# Patient Record
Sex: Male | Born: 1980 | Race: Black or African American | Hispanic: No | Marital: Married | Smoking: Never smoker
Health system: Southern US, Community
[De-identification: ages and names within clinical notes are randomized; demographics above are authoritative.]

## PROBLEM LIST (undated history)

## (undated) DIAGNOSIS — R51 Headache: Secondary | ICD-10-CM

## (undated) DIAGNOSIS — R519 Headache, unspecified: Secondary | ICD-10-CM

## (undated) DIAGNOSIS — G4489 Other headache syndrome: Principal | ICD-10-CM

## (undated) HISTORY — DX: Headache, unspecified: R51.9

## (undated) HISTORY — DX: Other headache syndrome: G44.89

## (undated) HISTORY — DX: Headache: R51

## (undated) HISTORY — PX: PATELLAR TENDON REPAIR: SHX737

---
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
## 2000-12-02 ENCOUNTER — Encounter: Payer: Self-pay | Admitting: Emergency Medicine

## 2000-12-02 ENCOUNTER — Emergency Department (HOSPITAL_COMMUNITY): Admission: EM | Admit: 2000-12-02 | Discharge: 2000-12-02 | Payer: Self-pay | Admitting: Emergency Medicine

## 2000-12-02 ENCOUNTER — Encounter: Payer: Self-pay | Admitting: *Deleted

## 2001-07-08 ENCOUNTER — Emergency Department (HOSPITAL_COMMUNITY): Admission: AC | Admit: 2001-07-08 | Discharge: 2001-07-08 | Payer: Self-pay

## 2001-07-08 ENCOUNTER — Encounter: Payer: Self-pay | Admitting: Emergency Medicine

## 2004-04-05 ENCOUNTER — Emergency Department (HOSPITAL_COMMUNITY): Admission: EM | Admit: 2004-04-05 | Discharge: 2004-04-05 | Payer: Self-pay | Admitting: Emergency Medicine

## 2004-04-06 ENCOUNTER — Emergency Department (HOSPITAL_COMMUNITY): Admission: EM | Admit: 2004-04-06 | Discharge: 2004-04-06 | Payer: Self-pay | Admitting: Emergency Medicine

## 2007-06-25 ENCOUNTER — Emergency Department (HOSPITAL_COMMUNITY): Admission: EM | Admit: 2007-06-25 | Discharge: 2007-06-25 | Payer: Self-pay | Admitting: Emergency Medicine

## 2008-09-09 ENCOUNTER — Emergency Department (HOSPITAL_COMMUNITY): Admission: EM | Admit: 2008-09-09 | Discharge: 2008-09-10 | Payer: Self-pay | Admitting: Emergency Medicine

## 2009-05-21 ENCOUNTER — Emergency Department (HOSPITAL_COMMUNITY): Admission: EM | Admit: 2009-05-21 | Discharge: 2009-05-21 | Payer: Self-pay | Admitting: Emergency Medicine
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
## 2010-06-18 ENCOUNTER — Inpatient Hospital Stay (INDEPENDENT_AMBULATORY_CARE_PROVIDER_SITE_OTHER)
Admission: RE | Admit: 2010-06-18 | Discharge: 2010-06-18 | Disposition: A | Discharge status: Home / Self Care | Payer: Self-pay | Source: Ambulatory Visit | Attending: Family Medicine | Admitting: Family Medicine

## 2010-06-18 DIAGNOSIS — R197 Diarrhea, unspecified: Secondary | ICD-10-CM

## 2010-06-18 LAB — POCT I-STAT, CHEM 8
BUN: 8 mg/dL (ref 6–23)
Calcium, Ion: 1.11 mmol/L — ABNORMAL LOW (ref 1.12–1.32)
Chloride: 105 mEq/L (ref 96–112)
Creatinine, Ser: 1.5 mg/dL (ref 0.4–1.5)
Glucose, Bld: 90 mg/dL (ref 70–99)
HCT: 45 % (ref 39.0–52.0)
Hemoglobin: 15.3 g/dL (ref 13.0–17.0)
Potassium: 4.3 mEq/L (ref 3.5–5.1)
Sodium: 140 mEq/L (ref 135–145)
TCO2: 26 mmol/L (ref 0–100)

## 2011-02-24 ENCOUNTER — Other Ambulatory Visit: Payer: Self-pay | Admitting: Family Medicine

## 2011-02-24 DIAGNOSIS — N63 Unspecified lump in unspecified breast: Secondary | ICD-10-CM

## 2011-03-03 ENCOUNTER — Other Ambulatory Visit: Payer: Self-pay

## 2011-03-13 ENCOUNTER — Ambulatory Visit
Admission: RE | Admit: 2011-03-13 | Discharge: 2011-03-13 | Disposition: A | Discharge status: Home / Self Care | Payer: BC Managed Care – PPO | Source: Ambulatory Visit | Attending: Family Medicine | Admitting: Family Medicine

## 2011-03-13 DIAGNOSIS — N63 Unspecified lump in unspecified breast: Secondary | ICD-10-CM

## 2011-07-04 ENCOUNTER — Other Ambulatory Visit: Payer: Self-pay | Admitting: Family Medicine

## 2011-07-04 DIAGNOSIS — N62 Hypertrophy of breast: Secondary | ICD-10-CM

## 2011-07-10 ENCOUNTER — Other Ambulatory Visit: Payer: Self-pay | Admitting: Family Medicine

## 2011-07-10 ENCOUNTER — Ambulatory Visit
Admission: RE | Admit: 2011-07-10 | Discharge: 2011-07-10 | Disposition: A | Discharge status: Home / Self Care | Payer: BC Managed Care – PPO | Source: Ambulatory Visit | Attending: Family Medicine | Admitting: Family Medicine

## 2011-07-10 DIAGNOSIS — N62 Hypertrophy of breast: Secondary | ICD-10-CM

## 2016-02-24 ENCOUNTER — Emergency Department (HOSPITAL_COMMUNITY)
Admission: EM | Admit: 2016-02-24 | Discharge: 2016-02-24 | Disposition: A | Discharge status: Home / Self Care | Payer: Self-pay | Attending: Emergency Medicine | Admitting: Emergency Medicine

## 2016-02-24 ENCOUNTER — Encounter (HOSPITAL_COMMUNITY): Payer: Self-pay | Admitting: Emergency Medicine

## 2016-02-24 ENCOUNTER — Emergency Department (HOSPITAL_COMMUNITY): Payer: Self-pay

## 2016-02-24 DIAGNOSIS — R112 Nausea with vomiting, unspecified: Secondary | ICD-10-CM | POA: Insufficient documentation

## 2016-02-24 DIAGNOSIS — Z79899 Other long term (current) drug therapy: Secondary | ICD-10-CM | POA: Insufficient documentation

## 2016-02-24 LAB — CBC
HEMATOCRIT: 44.5 % (ref 39.0–52.0)
HEMOGLOBIN: 15.8 g/dL (ref 13.0–17.0)
MCH: 30.8 pg (ref 26.0–34.0)
MCHC: 35.5 g/dL (ref 30.0–36.0)
MCV: 86.7 fL (ref 78.0–100.0)
Platelets: 250 10*3/uL (ref 150–400)
RBC: 5.13 MIL/uL (ref 4.22–5.81)
RDW: 12.9 % (ref 11.5–15.5)
WBC: 12.3 10*3/uL — ABNORMAL HIGH (ref 4.0–10.5)

## 2016-02-24 LAB — URINALYSIS, ROUTINE W REFLEX MICROSCOPIC
BACTERIA UA: NONE SEEN
Bilirubin Urine: NEGATIVE
Glucose, UA: NEGATIVE mg/dL
Hgb urine dipstick: NEGATIVE
Ketones, ur: 80 mg/dL — AB
LEUKOCYTES UA: NEGATIVE
Nitrite: NEGATIVE
PH: 8 (ref 5.0–8.0)
Protein, ur: 30 mg/dL — AB
SPECIFIC GRAVITY, URINE: 1.025 (ref 1.005–1.030)
SQUAMOUS EPITHELIAL / LPF: NONE SEEN

## 2016-02-24 LAB — COMPREHENSIVE METABOLIC PANEL
ALBUMIN: 4.8 g/dL (ref 3.5–5.0)
ALT: 36 U/L (ref 17–63)
ANION GAP: 14 (ref 5–15)
AST: 45 U/L — AB (ref 15–41)
Alkaline Phosphatase: 42 U/L (ref 38–126)
BILIRUBIN TOTAL: 1.1 mg/dL (ref 0.3–1.2)
BUN: 13 mg/dL (ref 6–20)
CHLORIDE: 102 mmol/L (ref 101–111)
CO2: 23 mmol/L (ref 22–32)
Calcium: 10 mg/dL (ref 8.9–10.3)
Creatinine, Ser: 1.05 mg/dL (ref 0.61–1.24)
GFR calc Af Amer: >60 mL/min (ref >60)
GFR calc non Af Amer: >60 mL/min (ref >60)
GLUCOSE: 107 mg/dL — AB (ref 65–99)
POTASSIUM: 3.6 mmol/L (ref 3.5–5.1)
SODIUM: 139 mmol/L (ref 135–145)
TOTAL PROTEIN: 8.1 g/dL (ref 6.5–8.1)

## 2016-02-24 LAB — LIPASE, BLOOD: LIPASE: 24 U/L (ref 11–51)

## 2016-02-24 LAB — I-STAT TROPONIN, ED: TROPONIN I, POC: 0 ng/mL (ref 0.00–0.08)

## 2016-02-24 MED ORDER — PROMETHAZINE HCL 25 MG PO TABS: 25.0000 mg | ORAL_TABLET | Freq: Four times a day (QID) | ORAL | 0 refills | Status: DC | PRN

## 2016-02-24 MED ORDER — FAMOTIDINE IN NACL 20-0.9 MG/50ML-% IV SOLN
20.0000 mg | Freq: Once | INTRAVENOUS | Status: AC
  Administered 2016-02-24: 20 mg via INTRAVENOUS
  Filled 2016-02-24: qty 50

## 2016-02-24 MED ORDER — SODIUM CHLORIDE 0.9 % IV BOLUS (SEPSIS)
1000.0000 mL | Freq: Once | INTRAVENOUS | Status: AC
  Administered 2016-02-24: 1000 mL via INTRAVENOUS

## 2016-02-24 MED ORDER — ONDANSETRON HCL 4 MG/2ML IJ SOLN
4.0000 mg | Freq: Once | INTRAMUSCULAR | Status: AC
  Administered 2016-02-24: 4 mg via INTRAVENOUS
  Filled 2016-02-24: qty 2

## 2016-02-24 MED ORDER — PROMETHAZINE HCL 25 MG/ML IJ SOLN
25.0000 mg | Freq: Once | INTRAMUSCULAR | Status: AC
  Administered 2016-02-24: 25 mg via INTRAVENOUS
  Filled 2016-02-24: qty 1

## 2016-02-24 MED ORDER — ONDANSETRON 4 MG PO TBDP
4.0000 mg | ORAL_TABLET | Freq: Once | ORAL | Status: AC | PRN
  Administered 2016-02-24: 4 mg via ORAL
  Filled 2016-02-24: qty 1

## 2016-02-24 NOTE — Progress Notes (Signed)
CM spoke [place] who [place].  CM discussed and provided written information to assist pt with determining choice for uninsured accepting pcps, discussed the importance of pcp vs EDP services for f/u care, www.needymeds.org, www.goodrx.com, discounted pharmacies and other [place] resources such as [facility] , [facility], affordable care act, financial assistance, uninsured dental services, [place] med assist, DSS and  health department  Reviewed resources for [place] uninsured accepting pcps like [facility], [facility] [address], [facility], [facility], local urgent care centers, [facility], [facility], general medical clinics, [facility] services of the [facility], [facility] plus others, medication resources, [facility] out patient [facility]ies and housing Pt voiced understanding and appreciation of resources provided   Provided [facility] contact information Pt was seen by Kennyth ArnoldStacy of [facility] prior to d/c

## 2016-02-24 NOTE — ED Triage Notes (Addendum)
Pt reports "nauseous shooting pain" since early this am. No diarrhea. Complains of abd pain. Pt also complains of palpitations and CP with nausea. No SOB or dizziness.

## 2016-02-24 NOTE — ED Provider Notes (Signed)
[facility]-EMERGENCY DEPT Provider Note   CSN: [id number] Arrival date & time: 02/24/16  [id number]     History   Chief Complaint Chief Complaint  Patient presents with  . Emesis    HPI Jerome Wood is a 36 y.o. male.  Pt presents to the ED today with nausea, vomiting, and hiccups since early this morning.  Pt said he will get nauseous, get a hiccup, then throw up.  He feels some cp as well.  He denies diarrhea or fevers.      History reviewed. No pertinent past medical history.  There are no active problems to display for this patient.   History reviewed. No pertinent surgical history.     Home Medications    Prior to Admission medications   Medication Sig Start Date End Date Taking? Authorizing Provider  OVER THE COUNTER MEDICATION Take 1 Package by mouth daily.   Yes Historical Provider, MD  promethazine (PHENERGAN) 25 MG tablet Take 1 tablet (25 mg total) by mouth every 6 (six) hours as needed for nausea or vomiting. 02/24/16   Jacalyn LefevreJulie Bellamie Turney, MD    Family History History reviewed. No pertinent family history.  Social History Social History  Substance Use Topics  . Smoking status: Never Smoker  . Smokeless tobacco: Never Used  . Alcohol use No     Allergies   Patient has no known allergies.   Review of Systems Review of Systems  Gastrointestinal: Positive for abdominal pain, nausea and vomiting.  All other systems reviewed and are negative.    Physical Exam Updated Vital Signs BP 146/75   Pulse 79   Temp 98.6 F (37 C) (Oral)   Resp 16   SpO2 100%   Physical Exam  Constitutional: He is oriented to person, place, and time. He appears well-developed and well-nourished.  HENT:  Head: Normocephalic and atraumatic.  Right Ear: External ear normal.  Left Ear: External ear normal.  Nose: Nose normal.  Mouth/Throat: Mucous membranes are dry.  Eyes: Conjunctivae and EOM are normal. Pupils are equal, round, and reactive to light.  Neck: Normal range  of motion. Neck supple.  Cardiovascular: Normal rate, regular rhythm, normal heart sounds and intact distal pulses.   Pulmonary/Chest: Effort normal and breath sounds normal.  Abdominal: Soft. Bowel sounds are normal.  Musculoskeletal: Normal range of motion.  Neurological: He is alert and oriented to person, place, and time.  Skin: Skin is warm.  Psychiatric: He has a normal mood and affect. His behavior is normal. Judgment and thought content normal.  Nursing note and vitals reviewed.    ED Treatments / Results  Labs (all labs ordered are listed, but only abnormal results are displayed) Labs Reviewed  COMPREHENSIVE METABOLIC PANEL - Abnormal; Notable for the following:       Result Value   Glucose, Bld 107 (*)    AST 45 (*)    All other components within normal limits  CBC - Abnormal; Notable for the following:    WBC 12.3 (*)    All other components within normal limits  URINALYSIS, ROUTINE W REFLEX MICROSCOPIC - Abnormal; Notable for the following:    Ketones, ur 80 (*)    Protein, ur 30 (*)    All other components within normal limits  LIPASE, BLOOD  I-STAT TROPOININ, ED    EKG  EKG Interpretation  Date/Time:  Thursday February 24 2016 07:35:48 EST Ventricular Rate:  71 PR Interval:    QRS Duration: 93 QT Interval:  379 QTC  Calculation: 412 R Axis:   75 Text Interpretation:  Sinus rhythm ST elev, probable normal early repol pattern Baseline wander in lead(s) V4 V5 V6 No old tracing to compare Confirmed by [facility] MD, Celeste Candelas [phone]) on 02/24/2016 9:30:22 AM       Radiology Dg Chest 2 View  Result Date: 02/24/2016 CLINICAL DATA:  Chest pain for several hours EXAM: CHEST  2 VIEW COMPARISON:  09/10/2008 FINDINGS: The heart size and mediastinal contours are within normal limits. Both lungs are clear. The visualized skeletal structures are unremarkable. IMPRESSION: No active cardiopulmonary disease. Electronically Signed   By: Alcide Clever M.D.   On: 02/24/2016 07:51     Procedures Procedures (including critical care time)  Medications Ordered in ED Medications  ondansetron (ZOFRAN-ODT) disintegrating tablet 4 mg (4 mg Oral Given 02/24/16 0758)  sodium chloride 0.9 % bolus 1,000 mL (1,000 mLs Intravenous New Bag/Given 02/24/16 0957)  ondansetron (ZOFRAN) injection 4 mg (4 mg Intravenous Given 02/24/16 0957)  famotidine (PEPCID) IVPB 20 mg premix (0 mg Intravenous Stopped 02/24/16 1032)  promethazine (PHENERGAN) injection 25 mg (25 mg Intravenous Given 02/24/16 1152)     Initial Impression / Assessment and Plan / ED Course  I have reviewed the triage vital signs and the nursing notes.  Pertinent labs & imaging results that were available during my care of the patient were reviewed by me and considered in my medical decision making (see chart for details).    Pt feels much better.  He is able to tolerate po fluids.  Final Clinical Impressions(s) / ED Diagnoses   Final diagnoses:  Non-intractable vomiting with nausea, unspecified vomiting type    New Prescriptions New Prescriptions   PROMETHAZINE (PHENERGAN) 25 MG TABLET    Take 1 tablet (25 mg total) by mouth every 6 (six) hours as needed for nausea or vomiting.     Jacalyn Lefevre, MD 02/24/16 1302

## 2016-02-24 NOTE — ED Notes (Signed)
Two RNs have attempted to start IV and obtain blood unsuccessfully.  Will put in for IV team consult or ultrasound IV.

## 2016-08-06 ENCOUNTER — Emergency Department (HOSPITAL_COMMUNITY): Payer: Self-pay

## 2016-08-06 ENCOUNTER — Emergency Department (HOSPITAL_COMMUNITY)
Admission: EM | Admit: 2016-08-06 | Discharge: 2016-08-06 | Disposition: A | Discharge status: Home / Self Care | Payer: Self-pay | Attending: Emergency Medicine | Admitting: Emergency Medicine

## 2016-08-06 ENCOUNTER — Encounter (HOSPITAL_COMMUNITY): Payer: Self-pay

## 2016-08-06 DIAGNOSIS — M93272 Osteochondritis dissecans, left ankle and joints of left foot: Secondary | ICD-10-CM | POA: Insufficient documentation

## 2016-08-06 DIAGNOSIS — M25572 Pain in left ankle and joints of left foot: Secondary | ICD-10-CM

## 2016-08-06 MED ORDER — IBUPROFEN 600 MG PO TABS: 600.0000 mg | ORAL_TABLET | Freq: Four times a day (QID) | ORAL | 0 refills | Status: DC | PRN

## 2016-08-06 MED ORDER — HYDROCODONE-ACETAMINOPHEN 5-325 MG PO TABS: 1.0000 | ORAL_TABLET | Freq: Four times a day (QID) | ORAL | 0 refills | Status: DC | PRN

## 2016-08-06 NOTE — Discharge Instructions (Signed)
You have been seen today for ankle pain. There were no acute abnormalities on the x-rays, including no sign of fracture or dislocation. However, there were signs of a cartilage abnormality on the xray.  Pain: Take 600 mg of ibuprofen every 6 hours or 440 mg (over the counter dose) to 500 mg (prescription dose) of naproxen every 12 hours or for the next 3 days. After this time, these medications may be used as needed for pain. Take these medications with food to avoid upset stomach. Choose only one of these medications, do not take them together.  Vicodin for severe pain. Do not drive or perform other dangerous activities while taking the Vicodin. Ice: May apply ice to the area over the next 24 hours for 15 minutes at a time to reduce swelling. Elevation: Keep the extremity elevated as often as possible to reduce pain and inflammation. Support: Wear the brace for support and comfort. Wear this until pain resolves. You will be weight-bearing as tolerated, which means you can slowly start to put weight on the extremity and increase amount and frequency as pain allows. Exercises: Start by performing these exercises a few times a week, increasing the frequency until you are performing them twice daily.  Follow up: Follow-up with the orthopedic specialist as soon as possible on this manner. Call the number provided to set up an appointment.

## 2016-08-06 NOTE — ED Triage Notes (Signed)
Pt with left ankle injury during flag football today.

## 2016-08-06 NOTE — ED Provider Notes (Signed)
[facility]-EMERGENCY DEPT Provider Note   CSN: [id number] Arrival date & time: 08/06/16  1657     History   Chief Complaint Chief Complaint  Patient presents with  . Ankle Injury    HPI Jerome Wood is a 36 y.o. male.  HPI    Jerome Wood is a 36 y.o. male, patient with no listed, pertinent past medical history, presenting to the ED with left ankle pain following a game of football today. Patient does not recall specific injury, but pain began after the physical activity and then worsened from there. Pain is moderate, aching, nonradiating. Patient states he has had pain in this ankle previously "for years," but has not been evaluated for it. Patient denies neuro deficits, falls, knee pain, or any other complaints.   History reviewed. No pertinent past medical history.  There are no active problems to display for this patient.   History reviewed. No pertinent surgical history.     Home Medications    Prior to Admission medications   Medication Sig Start Date End Date Taking? Authorizing Provider  HYDROcodone-acetaminophen (NORCO/VICODIN) 5-325 MG tablet Take 1-2 tablets by mouth every 6 (six) hours as needed for severe pain. 08/06/16   Hasnain Manheim C, PA-C  ibuprofen (ADVIL,MOTRIN) 600 MG tablet Take 1 tablet (600 mg total) by mouth every 6 (six) hours as needed. 08/06/16   Lutie Pickler C, PA-C  OVER THE COUNTER MEDICATION Take 1 Package by mouth daily.    [provider]  promethazine (PHENERGAN) 25 MG tablet Take 1 tablet (25 mg total) by mouth every 6 (six) hours as needed for nausea or vomiting. 02/24/16   Jacalyn Lefevre, MD    Family History History reviewed. No pertinent family history.  Social History Social History  Substance Use Topics  . Smoking status: Never Smoker  . Smokeless tobacco: Never Used  . Alcohol use No     Allergies   Patient has no known allergies.   Review of Systems Review of Systems  Musculoskeletal: Positive for arthralgias.  Negative for joint swelling.  Skin: Negative for wound.  Neurological: Negative for weakness and numbness.     Physical Exam Updated Vital Signs BP (!) 144/88 (BP Location: Left Arm)   Pulse 62   Temp 98 F (36.7 C) (Oral)   Resp 14   SpO2 98%   Physical Exam  Constitutional: He appears well-developed and well-nourished. No distress.  HENT:  Head: Normocephalic and atraumatic.  Eyes: Conjunctivae are normal.  Neck: Neck supple.  Cardiovascular: Normal rate, regular rhythm and intact distal pulses.   Pulmonary/Chest: Effort normal.  Musculoskeletal: He exhibits tenderness. He exhibits no edema or deformity.  Tenderness without swelling to the left lateral ankle inferior to the lateral malleolus. No noted crepitus, color change, deformity, or laxity. Patient is weightbearing, but with pain.   Neurological: He is alert.  Skin: Skin is warm and dry. Capillary refill takes less than 2 seconds. He is not diaphoretic.  Psychiatric: He has a normal mood and affect. His behavior is normal.  Nursing note and vitals reviewed.    ED Treatments / Results  Labs (all labs ordered are listed, but only abnormal results are displayed) Labs Reviewed - No data to display  EKG  EKG Interpretation None       Radiology Dg Ankle Complete Left  Result Date: 08/06/2016 CLINICAL DATA:  Pain EXAM: LEFT ANKLE COMPLETE - 3+ VIEW COMPARISON:  None. FINDINGS: Frontal, oblique, and lateral views were obtained. 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 No acute fracture evident. No joint effusion. No joint space narrowing or erosion. Ankle mortise appears intact. 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 No acute fracture or joint effusion. Ankle mortise appears intact. No appreciable joint space narrowing. Electronically Signed   By: Bretta BangWilliam  Woodruff III M.D.   On: 08/06/2016 18:06    Procedures Procedures (including  critical care time)  Medications Ordered in ED Medications - No data to display   Initial Impression / Assessment and Plan / ED Course  I have reviewed the triage vital signs and the nursing notes.  Pertinent labs & imaging results that were available during my care of the patient were reviewed by me and considered in my medical decision making (see chart for details).      Patient presents with left ankle pain following physical activity. 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 Patient was informed of this finding with explanation. Ankle brace, crutches, and orthopedic follow-up. The patient was given instructions for home care as well as return precautions. Patient voices understanding of these instructions, accepts the plan, and is comfortable with discharge.    Final Clinical Impressions(s) / ED Diagnoses   Final diagnoses:  Acute left ankle pain    New Prescriptions Discharge Medication List as of 08/06/2016  7:55 PM    START taking these medications   Details  HYDROcodone-acetaminophen (NORCO/VICODIN) 5-325 MG tablet Take 1-2 tablets by mouth every 6 (six) hours as needed for severe pain., Starting Sun 08/06/2016, Print    ibuprofen (ADVIL,MOTRIN) 600 MG tablet Take 1 tablet (600 mg total) by mouth every 6 (six) hours as needed., Starting Sun 08/06/2016, Print         Atoya Andrew C, PA-C 08/07/16 0919    Little, Ambrose Finlandachel Morgan, MD 08/07/16 1601

## 2017-03-01 ENCOUNTER — Emergency Department (HOSPITAL_COMMUNITY)
Admission: EM | Admit: 2017-03-01 | Discharge: 2017-03-01 | Disposition: A | Discharge status: Home / Self Care | Payer: Self-pay | Attending: Emergency Medicine | Admitting: Emergency Medicine

## 2017-03-01 ENCOUNTER — Other Ambulatory Visit: Payer: Self-pay

## 2017-03-01 ENCOUNTER — Emergency Department (HOSPITAL_COMMUNITY): Payer: Self-pay

## 2017-03-01 ENCOUNTER — Encounter (HOSPITAL_COMMUNITY): Payer: Self-pay | Admitting: Emergency Medicine

## 2017-03-01 DIAGNOSIS — I880 Nonspecific mesenteric lymphadenitis: Secondary | ICD-10-CM | POA: Insufficient documentation

## 2017-03-01 LAB — LIPASE, BLOOD: Lipase: 31 U/L (ref 11–51)

## 2017-03-01 LAB — COMPREHENSIVE METABOLIC PANEL
ALK PHOS: 53 U/L (ref 38–126)
ALT: 54 U/L (ref 17–63)
AST: 34 U/L (ref 15–41)
Albumin: 4.1 g/dL (ref 3.5–5.0)
Anion gap: 10 (ref 5–15)
BUN: 10 mg/dL (ref 6–20)
CALCIUM: 9.3 mg/dL (ref 8.9–10.3)
CHLORIDE: 98 mmol/L — AB (ref 101–111)
CO2: 27 mmol/L (ref 22–32)
Creatinine, Ser: 1.13 mg/dL (ref 0.61–1.24)
GFR calc Af Amer: >60 mL/min (ref >60)
GFR calc non Af Amer: >60 mL/min (ref >60)
GLUCOSE: 94 mg/dL (ref 65–99)
Potassium: 3.3 mmol/L — ABNORMAL LOW (ref 3.5–5.1)
SODIUM: 135 mmol/L (ref 135–145)
Total Bilirubin: 1.4 mg/dL — ABNORMAL HIGH (ref 0.3–1.2)
Total Protein: 7.7 g/dL (ref 6.5–8.1)

## 2017-03-01 LAB — CBC
HCT: 45.2 % (ref 39.0–52.0)
Hemoglobin: 16.2 g/dL (ref 13.0–17.0)
MCH: 30.6 pg (ref 26.0–34.0)
MCHC: 35.8 g/dL (ref 30.0–36.0)
MCV: 85.3 fL (ref 78.0–100.0)
PLATELETS: 159 10*3/uL (ref 150–400)
RBC: 5.3 MIL/uL (ref 4.22–5.81)
RDW: 12.3 % (ref 11.5–15.5)
WBC: 10.6 10*3/uL — ABNORMAL HIGH (ref 4.0–10.5)

## 2017-03-01 LAB — URINALYSIS, ROUTINE W REFLEX MICROSCOPIC
BILIRUBIN URINE: NEGATIVE
GLUCOSE, UA: NEGATIVE mg/dL
HGB URINE DIPSTICK: NEGATIVE
Ketones, ur: 5 mg/dL — AB
Leukocytes, UA: NEGATIVE
Nitrite: NEGATIVE
PROTEIN: NEGATIVE mg/dL
SPECIFIC GRAVITY, URINE: 1.029 (ref 1.005–1.030)
pH: 6 (ref 5.0–8.0)

## 2017-03-01 MED ORDER — IOPAMIDOL (ISOVUE-300) INJECTION 61%
INTRAVENOUS | Status: AC
  Administered 2017-03-01: 100 mL
  Filled 2017-03-01: qty 100

## 2017-03-01 MED ORDER — FENTANYL CITRATE (PF) 100 MCG/2ML IJ SOLN
50.0000 ug | Freq: Once | INTRAMUSCULAR | Status: AC
Start: 2017-03-01 — End: 2017-03-01
  Administered 2017-03-01: 50 ug via INTRAVENOUS
  Filled 2017-03-01: qty 2

## 2017-03-01 MED ORDER — ONDANSETRON HCL 4 MG/2ML IJ SOLN
4.0000 mg | Freq: Once | INTRAMUSCULAR | Status: AC
  Administered 2017-03-01: 4 mg via INTRAVENOUS
  Filled 2017-03-01: qty 2

## 2017-03-01 MED ORDER — DICYCLOMINE HCL 20 MG PO TABS
20.0000 mg | ORAL_TABLET | Freq: Once | ORAL | Status: AC
  Administered 2017-03-01: 20 mg via ORAL
  Filled 2017-03-01: qty 1

## 2017-03-01 MED ORDER — PROMETHAZINE HCL 25 MG PO TABS: 25.0000 mg | ORAL_TABLET | Freq: Four times a day (QID) | ORAL | 0 refills | Status: DC | PRN

## 2017-03-01 MED ORDER — OXYCODONE HCL 5 MG PO TABS: 2.5000 mg | ORAL_TABLET | ORAL | 0 refills | Status: DC | PRN

## 2017-03-01 MED ORDER — FENTANYL CITRATE (PF) 100 MCG/2ML IJ SOLN
50.0000 ug | Freq: Once | INTRAMUSCULAR | Status: AC
  Administered 2017-03-01: 50 ug via INTRAVENOUS
  Filled 2017-03-01: qty 2

## 2017-03-01 NOTE — ED Provider Notes (Signed)
[facility]-EMERGENCY DEPT Provider Note   CSN: [id number] Arrival date & time: 03/01/17  1258     History   Chief Complaint Chief Complaint  Patient presents with  . Abdominal Pain  . Emesis    HPI Jerome Wood is a 37 y.o. male  Abdominal Pain: Patient complains of abdominal pain. The pain is described as colicky, and is 6/10 in intensity. Pain is located in the RUQ/LUQ without radiation. Onset was 3 days ago. Symptoms have been gradually improving since. Aggravating factors: none.  Alleviating factors:none. Associated symptoms:emesis several  times, beginning 3 days ago, loss of appetite and diarrhea. The patient denies bloody or bilious emesis.. Patient sxs of N/V/D and abdominal pain began Monday.  He was out of town for the probable.  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  He denies any other travel, ingestion of suspicious foods.   HPI  History reviewed. No pertinent past medical history.  There are no active problems to display for this patient.   History reviewed. No pertinent surgical history.     Home Medications    Prior to Admission medications   Medication Sig Start Date End Date Taking? Authorizing Provider  acetaminophen (TYLENOL) 500 MG tablet Take 1,000 mg by mouth every 6 (six) hours as needed for mild pain.   Yes [provider]  promethazine (PHENERGAN) 25 MG tablet Take 1 tablet (25 mg total) by mouth every 6 (six) hours as needed for nausea or vomiting. 02/24/16  Yes Jacalyn LefevreHaviland, Julie, MD    Family History No family history on file.  Social History Social History   Tobacco Use  . Smoking status: Never Smoker  . Smokeless tobacco: Never Used  Substance Use Topics  . Alcohol use: No  . Drug use: Not on file     Allergies   Patient has no known allergies.   Review of Systems Review of Systems Ten systems reviewed and are negative for acute change, except as noted in  the HPI.    Physical Exam Updated Vital Signs BP (!) 165/97   Pulse 64   Temp 98.5 F (36.9 C) (Oral)   Resp 16   SpO2 97%   Physical Exam  Constitutional: He appears well-developed and well-nourished. No distress.  HENT:  Head: Normocephalic and atraumatic.  Eyes: Conjunctivae are normal. No scleral icterus.  Neck: Normal range of motion. Neck supple.  Cardiovascular: Normal rate, regular rhythm and normal heart sounds.  Pulmonary/Chest: Effort normal and breath sounds normal. No respiratory distress.  Abdominal: Soft. Bowel sounds are normal. There is tenderness in the right upper quadrant and right lower quadrant.  Musculoskeletal: He exhibits no edema.  Neurological: He is alert.  Skin: Skin is warm and dry. He is not diaphoretic.  Psychiatric: His behavior is normal.  Nursing note and vitals reviewed.    ED Treatments / Results  Labs (all labs ordered are listed, but only abnormal results are displayed) Labs Reviewed  URINALYSIS, ROUTINE W REFLEX MICROSCOPIC - Abnormal; Notable for the following components:      Result Value   Ketones, ur 5 (*)    All other components within normal limits  LIPASE, BLOOD  COMPREHENSIVE METABOLIC PANEL  CBC    EKG  EKG Interpretation None       Radiology No results found.  Procedures Procedures (including critical care time)  Medications Ordered in ED Medications  ondansetron (ZOFRAN) injection 4 mg (4 mg Intravenous Given  03/01/17 1554)  dicyclomine (BENTYL) tablet 20 mg (20 mg Oral Given 03/01/17 1553)     Initial Impression / Assessment and Plan / ED Course  I have reviewed the triage vital signs and the nursing notes.  Pertinent labs & imaging results that were available during my care of the patient were reviewed by me and considered in my medical decision making (see chart for details).     Patient with nausea vomiting diarrhea.  Point tender in the right lower quadrant with appendicolith seen on his CT  scan.  He also has written reactive mesenteric adenopathy.  I discussed the case with Dr. Ezzard Standing who states that having 4 days of persistent pain with nausea vomiting diarrhea is not consistent with appendicitis he does not feel that he needs to be seen or evaluated surgically.  Patient is not having any active vomiting or diarrhea at this time and symptoms do a seem to be improving.  He is noted to be hypertensive here and I have discussed this with the patient and he will need outpatient evaluation and management.  Patient unlikely to have bacterial process and will be treated symptom supportively.  His pain is fairly significant and will discharge patient with some pain medications.  I have advised the patient return for any worsening condition, intractable vomiting, fevers, new or concerning symptoms.  He appears appropriate for discharge at this time.  Final Clinical Impressions(s) / ED Diagnoses   Final diagnoses:  Acute mesenteric adenitis    ED Discharge Orders    None       Arthor Captain, PA-C 03/02/17 1610    Tilden Fossa, MD 03/02/17 1322

## 2017-03-01 NOTE — Discharge Instructions (Addendum)

## 2017-03-01 NOTE — ED Notes (Signed)
Pt verbalized being upset that this took so long and anxious to leave. While going over paperwork stated he does not need surgery, states its just a stomach bug and he is fine, just in pain from throwing up the last four days.

## 2017-03-01 NOTE — ED Triage Notes (Signed)
Pt complaint of abdominal pain with n/v/d since Monday; no n/v/d today.

## 2017-03-01 NOTE — ED Notes (Addendum)
[name] aware that patient c/o medications given has made pain worse.  Awaiting new orders.

## 2017-03-01 NOTE — ED Notes (Signed)
Dr Madilyn Hookees ED Provider at bedside.

## 2018-03-01 ENCOUNTER — Ambulatory Visit: Payer: 59 | Admitting: Neurology

## 2018-03-01 ENCOUNTER — Encounter: Payer: Self-pay | Admitting: Neurology

## 2018-03-01 ENCOUNTER — Other Ambulatory Visit: Payer: Self-pay

## 2018-03-01 DIAGNOSIS — G4489 Other headache syndrome: Secondary | ICD-10-CM

## 2018-03-01 HISTORY — DX: Other headache syndrome: G44.89

## 2018-03-01 MED ORDER — NORTRIPTYLINE HCL 10 MG PO CAPS: ORAL_CAPSULE | ORAL | 3 refills | Status: AC

## 2018-03-01 NOTE — Progress Notes (Signed)
Reason for visit: Headaches  Referring physician: Dr. Claudia Desanctis is a 38 y.o. male  History of present illness:  Jerome Wood is a 38 year old right-handed black male with a history of headaches that began in October 2019.  Prior to this, the patient would have only an occasional headache.  The headaches are associated with neck pain and stiffness and neuromuscular discomfort into the shoulders bilaterally.  The patient notes that the headaches will come up from the back of the head, sometimes he will have bitemporal headache pain with a squeezing sensation of the head.  The headaches were daily initially, but more recently they have cut back to being about twice a week.  The patient was placed on Aimovig and then converted to [facility] without benefit.  Flexeril was added but the patient has not continued this medication even though it helps some.  Diazepam was used occasionally but made the patient groggy and slow cognitively.  He indicates that he usually consumes at least one energy drink daily.  He has worked as a Systems analyst, but he has not done any workouts for 4 months.  The patient has soreness in the right shoulder that is more persistent.  He denies any numbness or weakness of extremities or difficulty with gait instability with exception that sometimes he feels a little bit off balance during a severe headache.  The headaches are not associated with photophobia or phonophobia.  He denies issues controlling the bowels or the bladder.  He is sent to this office for an evaluation.  He has undergone a CT scan of the brain, the results of this are not available to me.  The patient claims that his father has a history of sinus headaches.  Past Medical History:  Diagnosis Date  . Headache   . Headache syndrome 03/01/2018    Past Surgical History:  Procedure Laterality Date  . PATELLAR TENDON REPAIR Right     Family History  Problem Relation Age of Onset  . Diabetes  type II Mother   . Heart disease Father     Social history:  reports that he has never smoked. He has never used smokeless tobacco. He reports current alcohol use. He reports that he does not use drugs.  Medications:  Prior to Admission medications   Medication Sig Start Date End Date Taking? Authorizing Provider  cyclobenzaprine (FLEXERIL) 10 MG tablet  02/16/18  Yes [provider]  diazepam (VALIUM) 5 MG tablet  11/06/17  Yes [provider]  traMADol (ULTRAM) 50 MG tablet  01/14/18  Yes [provider]     No Known Allergies  ROS:  Out of a complete 14 system review of symptoms, the patient complains only of the following symptoms, and all other reviewed systems are negative.  Runny nose Restless legs, snoring Back pain  Blood pressure (!) 159/100, pulse 65, resp. rate 14, height 5\' 11"  (1.803 m), weight 236 lb (107 kg).  Physical Exam  General: The patient is alert and cooperative at the time of the examination.  Eyes: Pupils are equal, round, and reactive to light. Discs are flat bilaterally.  Neck: The neck is supple, no carotid bruits are noted.  Respiratory: The respiratory examination is clear.  Cardiovascular: The cardiovascular examination reveals a regular rate and rhythm, no obvious murmurs or rubs are noted.  Neuromuscular: Range of movement the cervical spine is relatively full.  No crepitus is noted in the temporomandibular joints.  Skin: Extremities are  without significant edema.  Neurologic Exam  Mental status: The patient is alert and oriented x 3 at the time of the examination. The patient has apparent normal recent and remote memory, with an apparently normal attention span and concentration ability.  Cranial nerves: Facial symmetry is present. There is good sensation of the face to pinprick and soft touch bilaterally. The strength of the facial muscles and the muscles to head turning and shoulder shrug are normal  bilaterally. Speech is well enunciated, no aphasia or dysarthria is noted. Extraocular movements are full. Visual fields are full. The tongue is midline, and the patient has symmetric elevation of the soft palate. No obvious hearing deficits are noted.  Motor: The motor testing reveals 5 over 5 strength of all 4 extremities. Good symmetric motor tone is noted throughout.  Sensory: Sensory testing is intact to pinprick, soft touch, vibration sensation, and position sense on all 4 extremities. No evidence of extinction is noted.  Coordination: Cerebellar testing reveals good finger-nose-finger and heel-to-shin bilaterally.  Gait and station: Gait is normal. Tandem gait is normal. Romberg is negative. No drift is seen.  Reflexes: Deep tendon reflexes are symmetric and normal bilaterally. Toes are downgoing bilaterally.   Assessment/Plan:  1.  Cervicogenic headache  The patient appears to have a headache syndrome most consistent with a cervicogenic headache with discomfort coming up from the shoulders and neck into the back of the head with a squeezing sensation.  The headaches have improved by cutting back on his workouts and by daily stretching.  The patient will be placed on nortriptyline taking 10 mg at night, working up to 30 mg nightly dose.  He will be sent for physical therapy for neuromuscular therapy.  He will follow-up here in about 3 months.  He will try to cut back on caffeine intake.  Jerome Wood. Keith Willis MD 03/01/2018 9:49 AM  [facility] [address] [place], [place] [id number]  Phone [phone] Fax [phone]

## 2018-03-01 NOTE — Patient Instructions (Signed)
We will start nortriptyline for the headache.   Pamelor (nortriptyline) is an antidepressant medication that has many uses that may include headache, whiplash injuries, or for peripheral neuropathy pain. Side effects may include drowsiness, dry mouth, blurred vision, or constipation. As with any antidepressant medication, worsening depression may occur. If you had any significant side effects, please call our office. The full effects of this medication may take 7-10 days after starting the drug, or going up on the dose.  

## 2019-05-26 IMAGING — CT CT ABD-PELV W/ CM
2 of 4 series · 16 of 46 positions shown, 18 images · IV contrast (iopamidol)
Comparison: 04/05/2004

CLINICAL DATA: Pt complaint of abdominal pain with n/v/d since
[REDACTED]; no n/v/d today.

EXAM:
CT ABDOMEN AND PELVIS WITH CONTRAST
TECHNIQUE: Multidetector CT imaging of the abdomen and pelvis was performed
using the standard protocol following bolus administration of
intravenous contrast.
CONTRAST:  100mL GE3SNL-2TT IOPAMIDOL (GE3SNL-2TT) INJECTION 61%

[Series 2: axial st · axial · 0.72mm/px · z∈[-622,-177]mm · 13 of 99 slices shown, 15 images]
[im 5/99  soft-tissue]
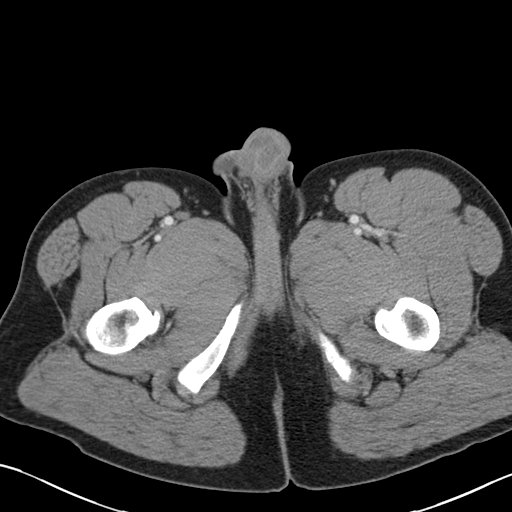
[im 5/99  bone]
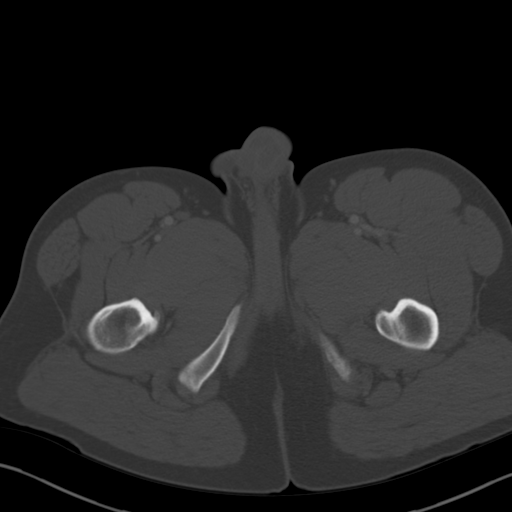
[im 14/99  soft-tissue]
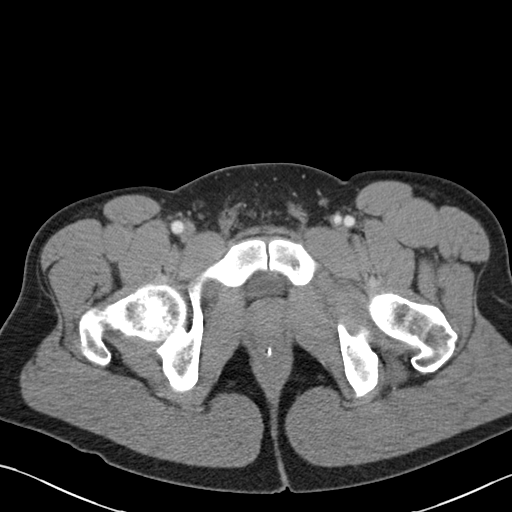
[im 23/99  soft-tissue]
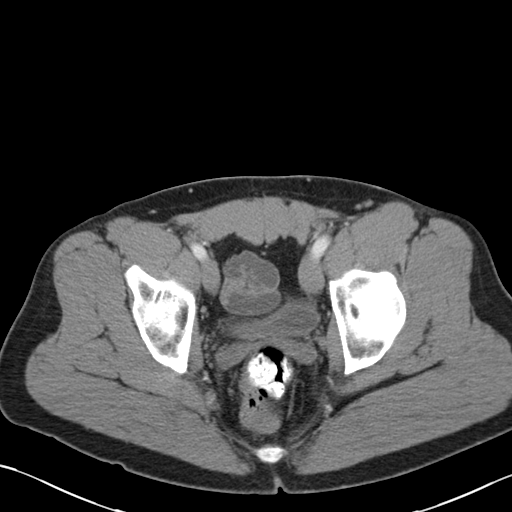
[im 27/99  soft-tissue]
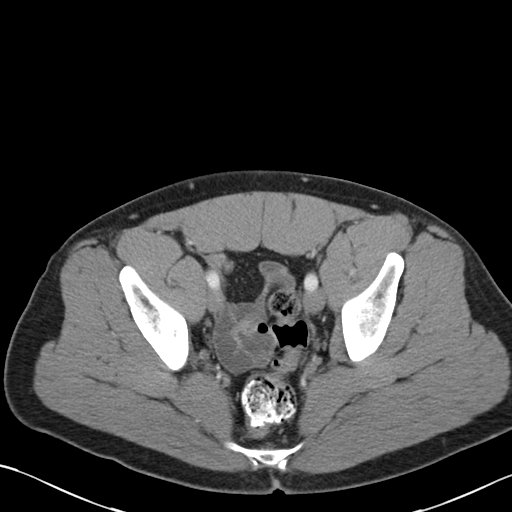
[im 36/99  soft-tissue]
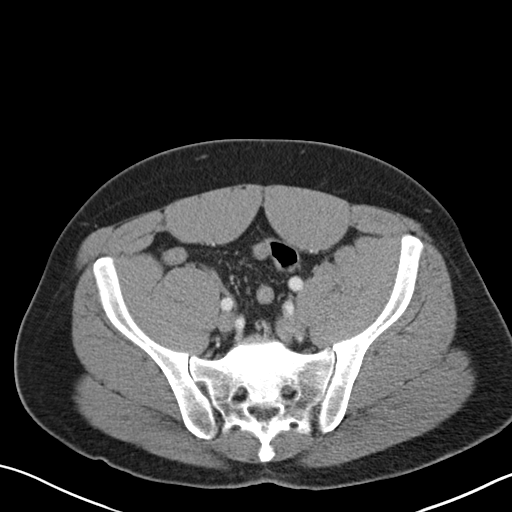
[im 41/99  soft-tissue]
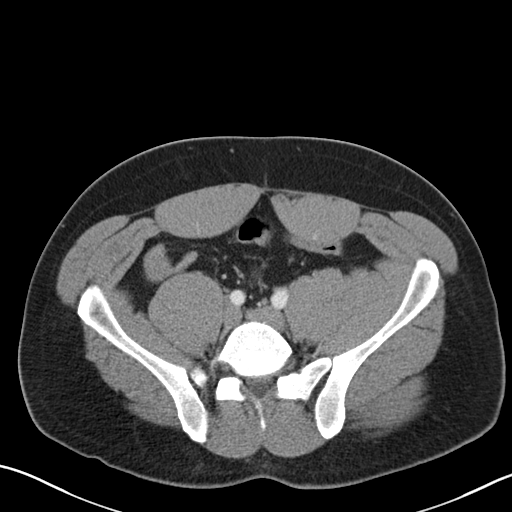
[im 49/99  soft-tissue]
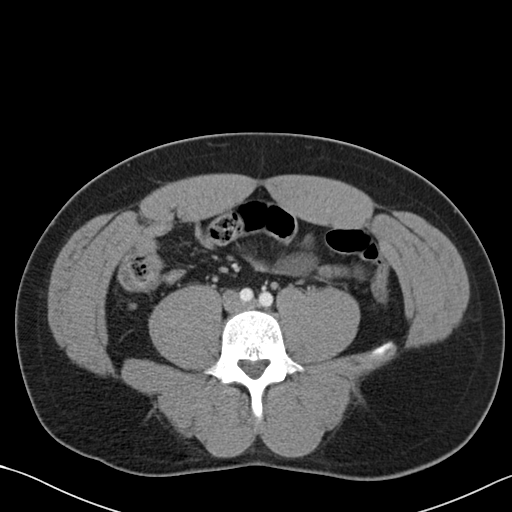
[im 58/99  soft-tissue]
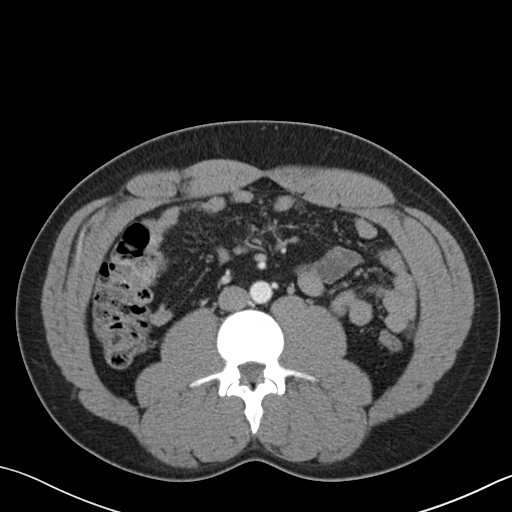
[im 63/99  soft-tissue]
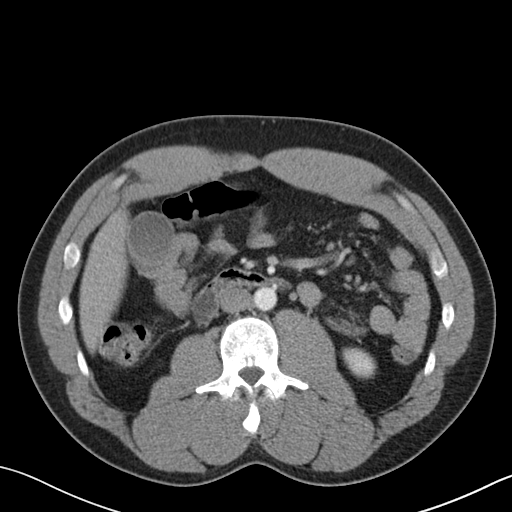
[im 63/99  bone]
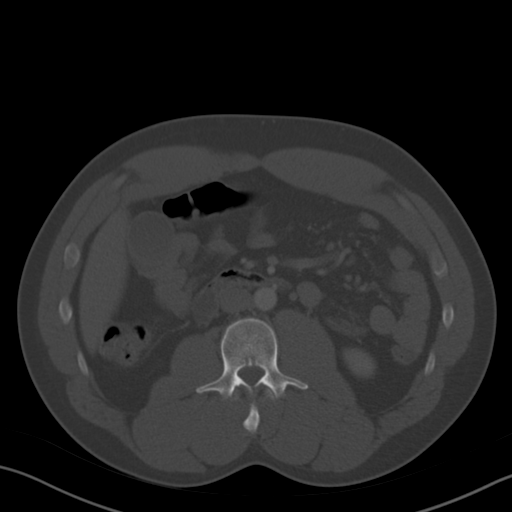
[im 72/99  soft-tissue]
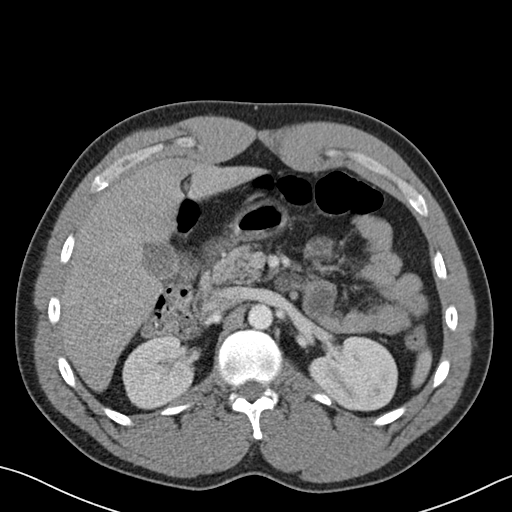
[im 76/99  soft-tissue]
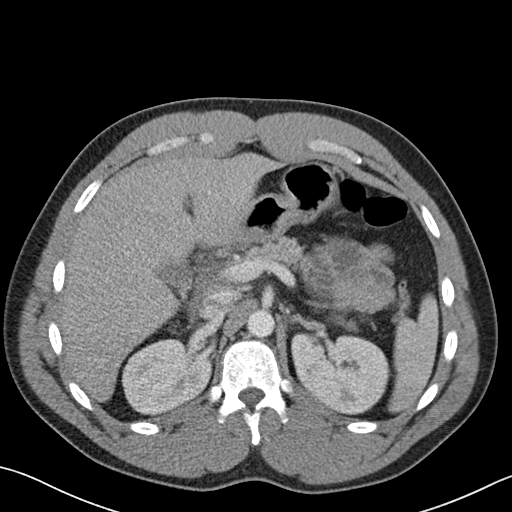
[im 85/99  soft-tissue]
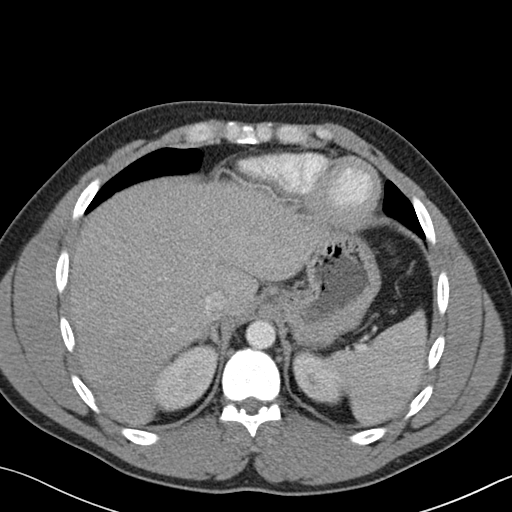
[im 94/99  soft-tissue]
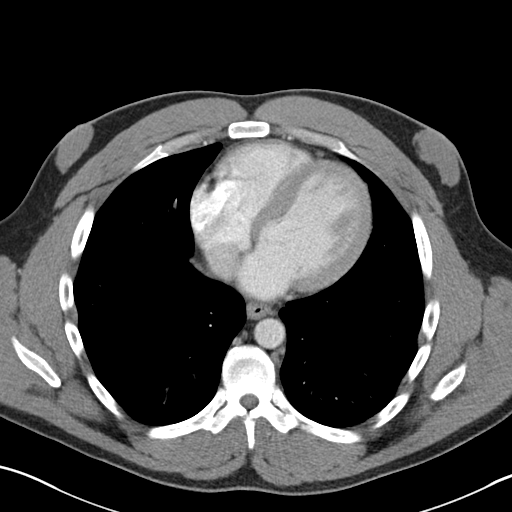

[Series 5: coronal st · coronal · 0.76mm/px · 3 of 94 slices shown]
[im 32/94  soft-tissue]
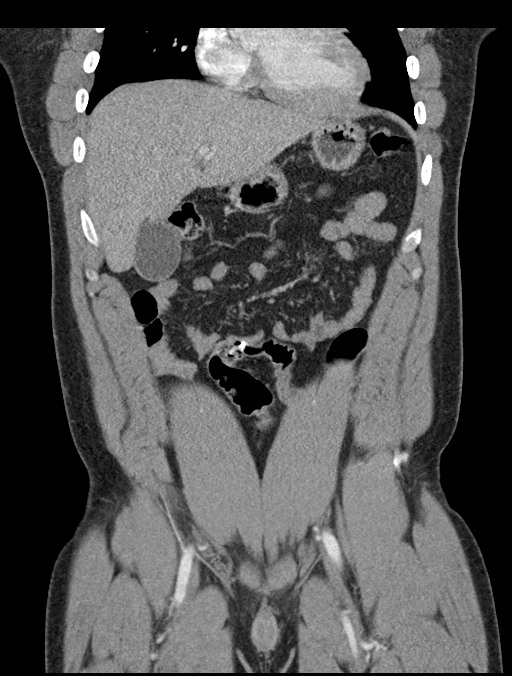
[im 42/94  soft-tissue]
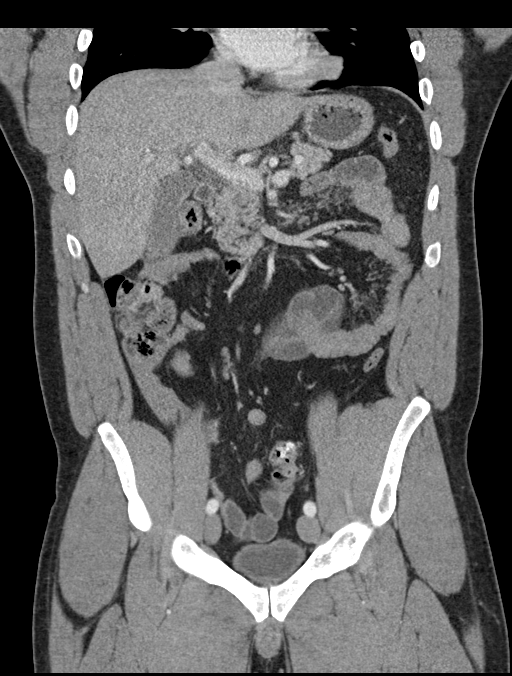
[im 52/94  soft-tissue]
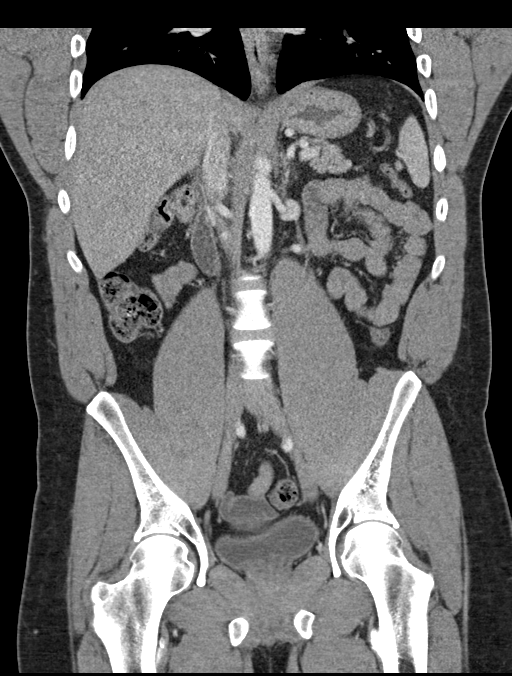

[16 of 46 positions shown; findings below may reference images not displayed]

FINDINGS: Lower chest: Lung bases are unremarkable. No pericardial effusion.
Note is made of bilateral gynecomastia.

Hepatobiliary: No focal liver abnormality is seen. No radiopaque
gallstones, biliary dilatation, or pericholecystic inflammatory
changes.

Pancreas: Unremarkable. No pancreatic ductal dilatation or
surrounding inflammatory changes.

Spleen: Normal in size without focal abnormality.

Adrenals/Urinary Tract: Adrenal glands are normal in appearance. No
hydronephrosis. No urinary tract obstruction. Urinary bladder is
decompressed and normal in appearance.

Stomach/Bowel: The stomach and small bowel loops are normal in
appearance. Loops of colon are normal in appearance. There is a
small appendicolith within normal caliber appendix. No
periappendiceal stranding.

Vascular/Lymphatic: No evidence for aortic aneurysm. There are small
mesenteric lymph nodes, measuring less than 1 centimeter in short
axis and nonspecific.

Reproductive: Prostate is unremarkable.

Other: No abdominal wall hernia or abnormality. No abdominopelvic
ascites.

Musculoskeletal: No acute or significant osseous findings.
IMPRESSION: 1. Appendicolith without other evidence for acute appendicitis.
2. Small mesenteric lymph nodes may indicate mesenteric adenitis. No
abscess or bowel obstruction.
3. Gynecomastia.

## 2022-08-16 ENCOUNTER — Emergency Department (HOSPITAL_COMMUNITY)
Admission: EM | Admit: 2022-08-16 | Discharge: 2022-08-16 | Disposition: A | Discharge status: Home / Self Care | Payer: Self-pay | Attending: Emergency Medicine | Admitting: Emergency Medicine

## 2022-08-16 ENCOUNTER — Encounter (HOSPITAL_COMMUNITY): Payer: Self-pay | Admitting: Emergency Medicine

## 2022-08-16 ENCOUNTER — Emergency Department (HOSPITAL_COMMUNITY): Payer: Self-pay

## 2022-08-16 ENCOUNTER — Other Ambulatory Visit: Payer: Self-pay

## 2022-08-16 DIAGNOSIS — M7989 Other specified soft tissue disorders: Secondary | ICD-10-CM | POA: Insufficient documentation

## 2022-08-16 DIAGNOSIS — W228XXA Striking against or struck by other objects, initial encounter: Secondary | ICD-10-CM | POA: Diagnosis not present

## 2022-08-16 DIAGNOSIS — R030 Elevated blood-pressure reading, without diagnosis of hypertension: Secondary | ICD-10-CM

## 2022-08-16 DIAGNOSIS — Y92007 Garden or yard of unspecified non-institutional (private) residence as the place of occurrence of the external cause: Secondary | ICD-10-CM | POA: Insufficient documentation

## 2022-08-16 DIAGNOSIS — S8991XA Unspecified injury of right lower leg, initial encounter: Secondary | ICD-10-CM | POA: Insufficient documentation

## 2022-08-16 DIAGNOSIS — Y990 Civilian activity done for income or pay: Secondary | ICD-10-CM | POA: Diagnosis not present

## 2022-08-16 MED ORDER — OXYCODONE-ACETAMINOPHEN 5-325 MG PO TABS
1.0000 | ORAL_TABLET | Freq: Once | ORAL | Status: AC
  Administered 2022-08-16: 1 via ORAL
  Filled 2022-08-16: qty 1

## 2022-08-16 MED ORDER — HYDROCODONE-ACETAMINOPHEN 5-325 MG PO TABS: 1.0000 | ORAL_TABLET | Freq: Four times a day (QID) | ORAL | 0 refills | Status: AC | PRN

## 2022-08-16 NOTE — ED Provider Notes (Addendum)
[place] EMERGENCY DEPARTMENT AT [facility] Provider Note   CSN: [id number] Arrival date & time: 08/16/22  1620     History  Chief Complaint  Patient presents with   Knee Injury    Jerome Wood is a 42 y.o. male.  Patient with noncontributory past medical history presents today with complaints of right knee injury.  He states that same occurred earlier today when he was mowing the yard at work and the wheel of the mower dipped down into a hole and he subsequently struck his right medial knee on a post.  He states he had some discomfort following the incident but was able to walk.  A few hours later he noticed that he was unable to bear any weight without significant pain.  He has a history of a patella fracture status postrepair of that knee several years ago. Of note, patient states that he had a physical today with his pcp and had a normal blood pressure.   The history is provided by the patient. No language interpreter was used.       Home Medications Prior to Admission medications   Medication Sig Start Date End Date Taking? Authorizing Provider  cyclobenzaprine (FLEXERIL) 10 MG tablet  02/16/18   [provider]  diazepam (VALIUM) 5 MG tablet  11/06/17   [provider]  nortriptyline (PAMELOR) 10 MG capsule Take one capsule at night for one week, then take 2 capsules at night for one week, then take 3 capsules at night 03/01/18   York Spaniel, MD  traMADol Janean Sark) 50 MG tablet  01/14/18   [provider]      Allergies    Patient has no known allergies.    Review of Systems   Review of Systems  Musculoskeletal:  Positive for arthralgias and myalgias.  All other systems reviewed and are negative.   Physical Exam Updated Vital Signs BP (!) 196/127 (BP Location: Right Arm)   Pulse 60   Temp 97.6 F (36.4 C) (Oral)   Resp 18   Ht 5\' 11"  (1.803 m)   Wt 107 kg   SpO2 100%   BMI 32.90 kg/m  Physical Exam Vitals and  nursing note reviewed.  Constitutional:      General: He is not in acute distress.    Appearance: Normal appearance. He is normal weight. He is not ill-appearing, toxic-appearing or diaphoretic.  HENT:     Head: Normocephalic and atraumatic.  Cardiovascular:     Rate and Rhythm: Normal rate.  Pulmonary:     Effort: Pulmonary effort is normal. No respiratory distress.  Musculoskeletal:        General: Normal range of motion.     Cervical back: Normal range of motion.     Comments: TTP of the right medial knee without overlying skin changes, bruising, or deformity. DP and PT pulses intact and 2+. ROM limited due to pain.   Skin:    General: Skin is warm and dry.  Neurological:     General: No focal deficit present.     Mental Status: He is alert.  Psychiatric:        Mood and Affect: Mood normal.        Behavior: Behavior normal.     ED Results / Procedures / Treatments   Labs (all labs ordered are listed, but only abnormal results are displayed) Labs Reviewed - No data to display  EKG None  Radiology DG Knee Complete 4 Views  Right  Result Date: 08/16/2022 CLINICAL DATA:  Right knee injury.  Hit right knee to the pole. EXAM: RIGHT KNEE - COMPLETE 4+ VIEW COMPARISON:  None Available. FINDINGS: There is no evidence of fracture or dislocation. Multiple surgical wires projecting over the inferior pole of the patella and tibial tuberosity suggesting prior patellar tendon repair. There are multiple well corticated osseous fragments extending along the superior aspect of the patellar tendon with soft tissue swelling. No appreciable joint effusion. IMPRESSION: 1. No evidence of acute fracture or dislocation. 2. Postsurgical changes for prior patellar tendon repair. Electronically Signed   By: Larose Hires D.O.   On: 08/16/2022 17:33    Procedures Procedures    Medications Ordered in ED Medications  oxyCODONE-acetaminophen (PERCOCET/ROXICET) 5-325 MG per tablet 1 tablet (1 tablet  Oral Given 08/16/22 1658)    ED Course/ Medical Decision Making/ A&P                             Medical Decision Making Amount and/or Complexity of Data Reviewed Radiology: ordered.  Risk Prescription drug management.   This patient is a 42 y.o. male  who presents to the ED for concern of right knee injury/pain.   Differential diagnoses prior to evaluation: The emergent differential diagnosis includes, but is not limited to,  trauma . This is not an exhaustive differential.   Past Medical History / Co-morbidities / Social History: Hx patella fracture in the effected knee status post surgical repair  Physical Exam: Physical exam performed. The pertinent findings include: Per above, TTP over the medial right knee without bruising, swelling, deformity, or overlying skin changes.  ROM limited due to pain.  Good distal pulses and sensation.  Lab Tests/Imaging studies: I personally interpreted labs/imaging and the pertinent results include:  DG right knee shows   1. No evidence of acute fracture or dislocation. 2. Postsurgical changes for prior patellar tendon repair.  I agree with the radiologist interpretation.   Medications: I ordered medication including percocet for pain.  I have reviewed the patients home medicines and have made adjustments as needed.   Disposition: After consideration of the diagnostic results and the patients response to treatment, I feel that emergency department workup does not suggest an emergent condition requiring admission or immediate intervention beyond what has been performed at this time. The plan is: discharge with knee immobilizer and crutches and close outpatient orthopedic follow-up.  Patient's x-ray is benign, however given his significant discomfort I do have some concern for a ligament injury.  I have discussed this with the patient who is understanding and in agreement with this.  Referral to orthopedics given and recommendations for RICE and  Tylenol/ibuprofen as needed for pain. Also sent for a few doses of vicodin for pain. PDMP reviewed.  Patient advised not to drive or operate heavy machinery with his medication.  Additionally, of note patient does have an elevated blood pressure reading today, however he does note that he was just at his primary doctor and had a normal blood pressure reading.  Given this, no indication to initiate blood pressure medications at this time.  Recommend that he checks his blood pressure regularly regularly and follows up with his primary doctor if his readings are consistently high.  Evaluation and diagnostic testing in the emergency department does not suggest an emergent condition requiring admission or immediate intervention beyond what has been performed at this time.  Plan for discharge with close PCP  follow-up.  Patient is understanding and amenable with plan, educated on red flag symptoms that would prompt immediate return.  Patient discharged in stable condition.   Final Clinical Impression(s) / ED Diagnoses Final diagnoses:  Injury of right knee, initial encounter  Elevated blood pressure reading    Rx / DC Orders ED Discharge Orders     None     An After Visit Summary was printed and given to the patient.     Vear Clock 08/16/22 1810    Karion Cudd, Shawn Route, PA-C 08/16/22 Aleda Grana, MD 08/21/22 [phone]

## 2022-08-16 NOTE — Progress Notes (Signed)
Orthopedic Tech Progress Note Patient Details:  Jerome Wood [birth year] [id number]  Ortho Devices Type of Ortho Device: Knee Immobilizer, Crutches Ortho Device/Splint Location: right knee immobilizer. crutches sized and instructed on use Ortho Device/Splint Interventions: Ordered, Application, Adjustment   Post Interventions Patient Tolerated: Well Instructions Provided: Care of device, Adjustment of device  Kizzie Fantasia 08/16/2022, 6:22 PM

## 2022-08-16 NOTE — ED Triage Notes (Signed)
Pt was on standing lawn mower when he was going around a pole, tire dipped down cause him to hit his right knee on pole. States he initially was able to walk on it but pain worsened and previous surgery to same knee.

## 2022-08-16 NOTE — Discharge Instructions (Addendum)
As we discussed, your workup in the ER today was reassuring for acute findings.  X-ray imaging of your knee did not reveal any fracture or dislocation.  However if you did have a ligament injury this would not show up on this x-ray.  Given this, I placed in a knee immobilizer and given you crutches to wear for support.  You will also need to call your orthopedist to schedule an appointment for follow-up evaluation of this.  If you do have a ligament injury they can address this.  In the interim, please rest, ice, compress, and elevate your leg and take Tylenol/ibuprofen as needed for pain.  Additionally, your blood pressure was high today.  Given that you are just at your primary doctor and did not have an elevated blood pressure, it is likely due to pain.  However, you should check this regularly and ensure that your blood pressure is in fact with normal.  Return if development of any new or worsening symptoms.
